# Patient Record
Sex: Male | Born: 2000 | Race: White | Hispanic: No | Marital: Single
Health system: Southern US, Community
[De-identification: ages and names within clinical notes are randomized; demographics above are authoritative.]

## PROBLEM LIST (undated history)

## (undated) ENCOUNTER — Ambulatory Visit: Admission: EM | Source: Home / Self Care

## (undated) DIAGNOSIS — T7840XA Allergy, unspecified, initial encounter: Secondary | ICD-10-CM

## (undated) DIAGNOSIS — J45909 Unspecified asthma, uncomplicated: Secondary | ICD-10-CM

## (undated) DIAGNOSIS — R0683 Snoring: Secondary | ICD-10-CM

## (undated) HISTORY — DX: Unspecified asthma, uncomplicated: J45.909

## (undated) HISTORY — DX: Snoring: R06.83

## (undated) HISTORY — PX: TYMPANOSTOMY TUBE PLACEMENT: SHX32

## (undated) HISTORY — PX: CIRCUMCISION: SUR203

## (undated) HISTORY — DX: Allergy, unspecified, initial encounter: T78.40XA

---
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
## 2020-09-01 ENCOUNTER — Ambulatory Visit: Payer: Self-pay | Admitting: *Deleted

## 2020-09-01 NOTE — Telephone Encounter (Signed)
°  Patient is calling from work- he has been having stomach pian since Sunday- sharp stabbing pain at times- bilateral- but mostly on the R side. Patient had nausea with pain today. No appointment in office- advised UC for evaluation- not sur eif patient is going to go. Reason for Disposition  [1] MILD-MODERATE pain AND [2] constant AND [3] present > 2 hours  Answer Assessment - Initial Assessment Questions 1. LOCATION: "Where does it hurt?"     Bilateral pain in abdomen- more R- lower 2. RADIATION: "Does the pain shoot anywhere else?" (e.g., chest, back)     No radiation 3. ONSET: "When did the pain begin?" (Minutes, hours or days ago)      Started Sunday afternoon 4. SUDDEN: "Gradual or sudden onset?"     Gradual pain 5. PATTERN "Does the pain come and go, or is it constant?"    - If constant: "Is it getting better, staying the same, or worsening?"      (Note: Constant means the pain never goes away completely; most serious pain is constant and it progresses)     - If intermittent: "How long does it last?" "Do you have pain now?"     (Note: Intermittent means the pain goes away completely between bouts)     Constant- sharp pain comes and goes 6. SEVERITY: "How bad is the pain?"  (e.g., Scale 1-10; mild, moderate, or severe)    - MILD (1-3): doesn't interfere with normal activities, abdomen soft and not tender to touch     - MODERATE (4-7): interferes with normal activities or awakens from sleep, tender to touch     - SEVERE (8-10): excruciating pain, doubled over, unable to do any normal activities       3-4- but when sharp- 8-9 7. RECURRENT SYMPTOM: "Have you ever had this type of stomach pain before?" If Yes, ask: "When was the last time?" and "What happened that time?"      no 8. CAUSE: "What do you think is causing the stomach pain?"     unknown 9. RELIEVING/AGGRAVATING FACTORS: "What makes it better or worse?" (e.g., movement, antacids, bowel movement)     Noting helping- has not  medicated 10. OTHER SYMPTOMS: "Has there been any vomiting, diarrhea, constipation, or urine problems?"       Some nausea with pain  Protocols used: ABDOMINAL PAIN - MALE-A-AH

## 2020-09-02 ENCOUNTER — Encounter: Payer: Self-pay | Admitting: Emergency Medicine

## 2020-09-02 ENCOUNTER — Ambulatory Visit: Payer: Self-pay

## 2020-09-02 ENCOUNTER — Emergency Department
Admission: EM | Admit: 2020-09-02 | Discharge: 2020-09-02 | Disposition: A | Discharge status: Home / Self Care | Payer: BC Managed Care – PPO | Attending: Emergency Medicine | Admitting: Emergency Medicine

## 2020-09-02 ENCOUNTER — Other Ambulatory Visit: Payer: Self-pay

## 2020-09-02 ENCOUNTER — Emergency Department: Payer: BC Managed Care – PPO

## 2020-09-02 DIAGNOSIS — Z7722 Contact with and (suspected) exposure to environmental tobacco smoke (acute) (chronic): Secondary | ICD-10-CM | POA: Insufficient documentation

## 2020-09-02 DIAGNOSIS — R1031 Right lower quadrant pain: Secondary | ICD-10-CM | POA: Diagnosis present

## 2020-09-02 DIAGNOSIS — J45909 Unspecified asthma, uncomplicated: Secondary | ICD-10-CM | POA: Diagnosis not present

## 2020-09-02 DIAGNOSIS — I880 Nonspecific mesenteric lymphadenitis: Secondary | ICD-10-CM | POA: Diagnosis not present

## 2020-09-02 LAB — CBC
HCT: 44.3 % (ref 39.0–52.0)
Hemoglobin: 15.4 g/dL (ref 13.0–17.0)
MCH: 28.9 pg (ref 26.0–34.0)
MCHC: 34.8 g/dL (ref 30.0–36.0)
MCV: 83.1 fL (ref 80.0–100.0)
Platelets: 197 10*3/uL (ref 150–400)
RBC: 5.33 MIL/uL (ref 4.22–5.81)
RDW: 12.7 % (ref 11.5–15.5)
WBC: 5.3 10*3/uL (ref 4.0–10.5)
nRBC: 0 % (ref 0.0–0.2)

## 2020-09-02 LAB — COMPREHENSIVE METABOLIC PANEL
ALT: 23 U/L (ref 0–44)
AST: 15 U/L (ref 15–41)
Albumin: 4.7 g/dL (ref 3.5–5.0)
Alkaline Phosphatase: 59 U/L (ref 38–126)
Anion gap: 9 (ref 5–15)
BUN: 15 mg/dL (ref 6–20)
CO2: 28 mmol/L (ref 22–32)
Calcium: 9.6 mg/dL (ref 8.9–10.3)
Chloride: 101 mmol/L (ref 98–111)
Creatinine, Ser: 0.93 mg/dL (ref 0.61–1.24)
GFR, Estimated: >60 mL/min (ref >60)
Glucose, Bld: 95 mg/dL (ref 70–99)
Potassium: 4 mmol/L (ref 3.5–5.1)
Sodium: 138 mmol/L (ref 135–145)
Total Bilirubin: 1.1 mg/dL (ref 0.3–1.2)
Total Protein: 7.6 g/dL (ref 6.5–8.1)

## 2020-09-02 LAB — URINALYSIS, COMPLETE (UACMP) WITH MICROSCOPIC
Bacteria, UA: NONE SEEN
Bilirubin Urine: NEGATIVE
Glucose, UA: NEGATIVE mg/dL
Hgb urine dipstick: NEGATIVE
Ketones, ur: NEGATIVE mg/dL
Leukocytes,Ua: NEGATIVE
Nitrite: NEGATIVE
Protein, ur: NEGATIVE mg/dL
Specific Gravity, Urine: 1.018 (ref 1.005–1.030)
Squamous Epithelial / LPF: NONE SEEN (ref 0–5)
pH: 7 (ref 5.0–8.0)

## 2020-09-02 LAB — LIPASE, BLOOD: Lipase: 31 U/L (ref 11–51)

## 2020-09-02 MED ORDER — IOHEXOL 300 MG/ML  SOLN
100.0000 mL | Freq: Once | INTRAMUSCULAR | Status: AC | PRN
  Administered 2020-09-02: 100 mL via INTRAVENOUS
  Filled 2020-09-02: qty 100

## 2020-09-02 MED ORDER — ONDANSETRON 4 MG PO TBDP: 4.0000 mg | ORAL_TABLET | Freq: Three times a day (TID) | ORAL | 0 refills | Status: AC | PRN

## 2020-09-02 NOTE — ED Triage Notes (Signed)
Pt presents to ED via POV with c/o RLQ abdominal pain x 4 days. Pt states has been intermittently dry heaving due to pain. Pt denies any abdominal surgeries, A&O x4, ambulatory to triage with steady gait. Respirations even and unlabored.

## 2020-09-02 NOTE — Discharge Instructions (Addendum)
Your exam is overall normal as well as your labs.  Your CT scan revealed a normal appendix but did show some inflammation of the lymph nodes within the lower abdomen.  This likely is a source of your tenderness and pain.  Continue to monitor symptoms.  Take Tylenol Motrin as needed.  Follow-up with your provider or return to the ED for sharply worsening symptoms as discussed.

## 2020-09-02 NOTE — Telephone Encounter (Signed)
Pt. Is in ED now waiting to be seen for abdominal pain. Has pain right lower quadrant that started Sunday. No availability in the practice today per [place]. Instructed pt. To stay and be seen in ED. Verbalizes understanding.  Reason for Disposition . [1] MILD-MODERATE pain AND [2] constant AND [3] present > 2 hours  Answer Assessment - Initial Assessment Questions 1. LOCATION: "Where does it hurt?"     Right lower 2. RADIATION: "Does the pain shoot anywhere else?" (e.g., chest, back)     No 3. ONSET: "When did the pain begin?" (Minutes, hours or days ago)      Sunday 4. SUDDEN: "Gradual or sudden onset?"     Gradual 5. PATTERN "Does the pain come and go, or is it constant?"    - If constant: "Is it getting better, staying the same, or worsening?"      (Note: Constant means the pain never goes away completely; most serious pain is constant and it progresses)     - If intermittent: "How long does it last?" "Do you have pain now?"     (Note: Intermittent means the pain goes away completely between bouts)     Constant 6. SEVERITY: "How bad is the pain?"  (e.g., Scale 1-10; mild, moderate, or severe)    - MILD (1-3): doesn't interfere with normal activities, abdomen soft and not tender to touch     - MODERATE (4-7): interferes with normal activities or awakens from sleep, tender to touch     - SEVERE (8-10): excruciating pain, doubled over, unable to do any normal activities       5 7. RECURRENT SYMPTOM: "Have you ever had this type of stomach pain before?" If Yes, ask: "When was the last time?" and "What happened that time?"      No 8. CAUSE: "What do you think is causing the stomach pain?"     Unsure 9. RELIEVING/AGGRAVATING FACTORS: "What makes it better or worse?" (e.g., movement, antacids, bowel movement)     No 10. OTHER SYMPTOMS: "Has there been any vomiting, diarrhea, constipation, or urine problems?"       No  Protocols used: ABDOMINAL PAIN - MALE-A-AH

## 2020-09-02 NOTE — ED Provider Notes (Signed)
[facility] Emergency Department Provider Note ____________________________________________  Time seen: 1207  I have reviewed the triage vital signs and the nursing notes.  HISTORY  Chief Complaint  Abdominal Pain   HPI Nathan Greene is a 19 y.o. male, without significant medical history, presents himself to the ED for evaluation of right lower quadrant abdominal pain.  Patient describes a 4-day complaint of pain has been intermittent. He had sudden onset of doubling-over RLQ pain on Sunday, he is also had severe dry heaving secondary to the pain.  He denies any previous history of abdominal surgeries, diarrhea, constipation, or food aversion. He reports the pain is persistent and intermittently jumps to a sharp, stabbing pain. He is without anorexia, fevers, vomiting, dysuria or bowel changes  Past Medical History:  Diagnosis Date  . Allergy   . Asthma   . Snoring     Patient Active Problem List   Diagnosis Date Noted  . History of cholesteatoma 12/15/2015  . Perennial allergic rhinitis with seasonal variation 03/10/2008  . Asthma, mild intermittent 01/29/2008    Past Surgical History:  Procedure Laterality Date  . CIRCUMCISION  [id number]  . MYRINGOTOMY WITH TUBE PLACEMENT Bilateral 12/11/2015   Procedure: MYRINGOTOMY WITH TUBE PLACEMENT;  Surgeon: Linus Salmons, MD;  Location: [facility];  Service: ENT;  Laterality: Bilateral;  BUTTERFLY TUBES  . TYMPANOSTOMY TUBE PLACEMENT      Prior to Admission medications   Medication Sig Start Date End Date Taking? Authorizing Provider  albuterol (PROAIR HFA) 108 (90 Base) MCG/ACT inhaler Inhale 2 puffs into the lungs every 6 (six) hours as needed for wheezing or shortness of breath. 04/24/17   Alba Cory, MD  fluticasone (FLONASE) 50 MCG/ACT nasal spray Place 2 sprays into both nostrils daily. 04/24/17   Alba Cory, MD  loratadine (CLARITIN) 10 MG tablet Take 1 tablet (10 mg total) by mouth  daily. 04/24/17   Alba Cory, MD  ondansetron (ZOFRAN ODT) 4 MG disintegrating tablet Take 1 tablet (4 mg total) by mouth every 8 (eight) hours as needed. 09/02/20   Sylis Ketchum, Charlesetta Ivory, PA-C  triamcinolone cream (KENALOG) 0.1 % Apply 1 application topically 2 (two) times daily. 04/24/17   Alba Cory, MD    Allergies Patient has no known allergies.  Family History  Problem Relation Age of Onset  . Diabetes Father   . Rashes / Skin problems Sister   . ADD / ADHD Brother     Social History Social History   Tobacco Use  . Smoking status: Passive Smoke Exposure - Never Smoker  . Smokeless tobacco: Never Used  Substance Use Topics  . Alcohol use: No    Alcohol/week: 0.0 standard drinks  . Drug use: No    Review of Systems  Constitutional: Negative for fever. Eyes: Negative for visual changes. ENT: Negative for sore throat. Cardiovascular: Negative for chest pain. Respiratory: Negative for shortness of breath. Gastrointestinal: Positive for RLQ abdominal pain. Denies nausea, vomiting and diarrhea. Genitourinary: Negative for dysuria. Musculoskeletal: Negative for back pain. Skin: Negative for rash. Neurological: Negative for headaches, focal weakness or numbness. ____________________________________________  PHYSICAL EXAM:  VITAL SIGNS: ED Triage Vitals  Enc Vitals Group     BP 09/02/20 0816 127/74     Pulse Rate 09/02/20 0816 (!) 51     Resp 09/02/20 0816 20     Temp 09/02/20 0816 98 F (36.7 C)     Temp Source 09/02/20 0816 Oral     SpO2 09/02/20 0816 100 %  Weight 09/02/20 0817 160 lb (72.6 kg)     Height 09/02/20 0817 5\' 6"  (1.676 m)     Head Circumference --      Peak Flow --      Pain Score 09/02/20 0816 8     Pain Loc --      Pain Edu? --      Excl. in GC? --     Constitutional: Alert and oriented. Well appearing and in no distress. Head: Normocephalic and atraumatic. Eyes: Conjunctivae are normal. Normal extraocular  movements Cardiovascular: Normal rate, regular rhythm. Normal distal pulses. Respiratory: Normal respiratory effort. No wheezes/rales/rhonchi. Gastrointestinal: Soft, flat, and generally nontender, except for tenderness over RLQ at McBurney's point. Positive Rovsing's sign. No distention, rebound, guarding, or rigidity. Normoactive bowel sounds x 4 quadrants. No CVA tenderness on evaluation.  Musculoskeletal: Nontender with normal range of motion in all extremities.  Neurologic:  Normal gait without ataxia. Normal speech and language. No gross focal neurologic deficits are appreciated. Skin:  Skin is warm, dry and intact. No rash noted. Psychiatric: Mood and affect are normal. Patient exhibits appropriate insight and judgment. ____________________________________________   LABS (pertinent positives/negatives) Labs Reviewed  URINALYSIS, COMPLETE (UACMP) WITH MICROSCOPIC - Abnormal; Notable for the following components:      Result Value   Color, Urine YELLOW (*)    APPearance CLEAR (*)    All other components within normal limits  LIPASE, BLOOD  COMPREHENSIVE METABOLIC PANEL  CBC  ____________________________________________   RADIOLOGY  CT ABD/Pelvis w/ CM  IMPRESSION: Normal appendix.  Shotty mesenteric adenopathy within the ileocecal lymph node chain, possibly reactive or inflammatory. No pathologic adenopathy within the abdomen and pelvis. ____________________________________________  PROCEDURES  Procedures ____________________________________________  INITIAL IMPRESSION / ASSESSMENT AND PLAN / ED COURSE  Differential diagnosis includes, but is not limited to, acute appendicitis, renal colic, testicular torsion, urinary tract infection/pyelonephritis, prostatitis,  epididymitis, diverticulitis, small bowel obstruction or ileus, colitis, abdominal aortic aneurysm, gastroenteritis, hernia, etc.  Patient with a 4-day complaint of right lower quadrant abdominal pain.  His  labs are stable, without leukocytosis acute anemia, or signs of liver or kidney dysfunction.  The patient is without interim nausea vomiting during his course.  He was evaluated with a CT scan for his complaints which showed a normal appendix, but reveal terminal ileitis, and mesenteric adenitis.   I reviewed the CT images and discussed the case with Dr. 09/04/20 (Surg). He is satisfisied that the appendix was fully visualized, and the adenitis is consistent with the exam findings.   Patient was updated and relieved by his CT results. I have discussed the results with the patient's stepfather, at his consent. The patient is discharged home, in stable condition, with a work note, nausea medicine, and return precautions.   Nathan Greene was evaluated in Emergency Department on 09/02/2020 for the symptoms described in the history of present illness. He was evaluated in the context of the global COVID-19 pandemic, which necessitated consideration that the patient might be at risk for infection with the SARS-CoV-2 virus that causes COVID-19. Institutional protocols and algorithms that pertain to the evaluation of patients at risk for COVID-19 are in a state of rapid change based on information released by regulatory bodies including the CDC and federal and state organizations. These policies and algorithms were followed during the patient's care in the ED. ____________________________________________  FINAL CLINICAL IMPRESSION(S) / ED DIAGNOSES  Final diagnoses:  Right lower quadrant abdominal pain  Mesenteric adenitis  Lissa Hoard, PA-C 09/02/20 1425    Phineas Semen, MD 09/02/20 1430

## 2020-09-02 NOTE — ED Notes (Signed)
Pt denies any n/v/d.

## 2021-04-06 ENCOUNTER — Ambulatory Visit: Payer: BC Managed Care – PPO | Admitting: Dermatology

## 2021-04-06 ENCOUNTER — Other Ambulatory Visit: Payer: Self-pay

## 2021-04-06 DIAGNOSIS — L209 Atopic dermatitis, unspecified: Secondary | ICD-10-CM | POA: Diagnosis not present

## 2021-04-06 MED ORDER — PIMECROLIMUS 1 % EX CREA: TOPICAL_CREAM | CUTANEOUS | 2 refills | Status: AC

## 2021-04-06 MED ORDER — CLOBETASOL PROPIONATE 0.05 % EX LOTN: TOPICAL_LOTION | CUTANEOUS | 2 refills | Status: AC

## 2021-04-06 NOTE — Progress Notes (Signed)
   Follow-Up Visit   Subjective  Nathan Greene is a 20 y.o. male who presents for the following: Psoriasis (Hands x several years. He has used TMC 0.1% Cream and Eucrisa Ointment in the past, but ran out a couple of months ago. 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 Hands itch a lot.  Patient has allergies and asthma.  The following portions of the chart were reviewed this encounter and updated as appropriate:       Review of Systems:  No other skin or systemic complaints except as noted in HPI or Assessment and Plan.  Objective  Well appearing patient in no apparent distress; mood and affect are within normal limits.  A focused examination was performed including hands, arms. Relevant physical exam findings are noted in the Assessment and Plan.  Objective  hands: Pink scaly patches of the webspaces, proximal nail folds, MCPs hands with generalized xerosis  3% BSA   Assessment & Plan  Atopic dermatitis, unspecified type hands  Hand dermatitis- not improving with topical treatment  Atopic dermatitis (eczema) is a chronic, relapsing, pruritic condition that can significantly affect quality of life. It is often associated with allergic rhinitis and/or asthma and can require treatment with topical medications, phototherapy, or in severe cases a biologic medication called Dupixent in older children and adults.   Hand Dermatitis is a chronic type of eczema that can come and go on the hands and fingers.  While there is no cure, the rash and symptoms can be managed with topical prescription medications, and for more severe cases, with systemic medications.  Recommend mild soap and routine use of moisturizing cream after handwashing.  Minimize soap/water exposure when possible.     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  The rash on his hands is causing problems with his work.  Discussed Dupixent injections. Patient was  given Dupixent paperwork. He will fill out and bring back to office.   Start Elidel Cream Apply to AA hands QD/BID until rash improved dsp 60g 2Rf.  Start Clobetasol lotion Apply to AA hands QD/BID until rash improved 2Rf.  Avoid face, groin, axilla.  Minimize harsh soaps on hands, use moisturizers after handwashing. Minimize frequency of handwashing when possible.  Topical steroids (such as triamcinolone, fluocinolone, fluocinonide, mometasone, clobetasol, halobetasol, betamethasone, hydrocortisone) can cause thinning and lightening of the skin if they are used for too long in the same area. Your physician has selected the right strength medicine for your problem and area affected on the body. Please use your medication only as directed by your physician to prevent side effects.       pimecrolimus (ELIDEL) 1 % cream - hands  Clobetasol Propionate 0.05 % lotion - hands  Return in about 1 month (around 05/07/2021) for AD.   Documentation: I have reviewed the above documentation for accuracy and completeness, and I agree with the above.  Willeen Niece MD

## 2021-04-06 NOTE — Patient Instructions (Addendum)
Topical steroids (such as triamcinolone, fluocinolone, fluocinonide, mometasone, clobetasol, halobetasol, betamethasone, hydrocortisone) can cause thinning and lightening of the skin if they are used for too long in the same area. Your physician has selected the right strength medicine for your problem and area affected on the body. Please use your medication only as directed by your physician to prevent side effects.    If you have any questions or concerns for your doctor, please call our main line at [phone] and press option 4 to reach your doctor's medical assistant. If no one answers, please leave a voicemail as directed and we will return your call as soon as possible. Messages left after 4 pm will be answered the following business day.   You may also send us a message via MyChart. We typically respond to MyChart messages within 1-2 business days.  For prescription refills, please ask your pharmacy to contact our office. Our fax number is [phone].  If you have an urgent issue when the clinic is closed that cannot wait until the next business day, you can page your doctor at the number below.    Please note that while we do our best to be available for urgent issues outside of office hours, we are not available 24/7.   If you have an urgent issue and are unable to reach us, you may choose to seek medical care at your doctor's office, retail clinic, urgent care center, or emergency room.  If you have a medical emergency, please immediately call 911 or go to the emergency department.  Pager Numbers  - Dr. Kowalski: [phone]  - Dr. Moye: [phone]  - Dr. Stewart: [phone]  In the event of inclement weather, please call our main line at [phone] for an update on the status of any delays or closures.  Dermatology Medication Tips: Please keep the boxes that topical medications come in in order to help keep track of the instructions about where and how to use  these. Pharmacies typically print the medication instructions only on the boxes and not directly on the medication tubes.   If your medication is too expensive, please contact our office at [phone] option 4 or send us a message through MyChart.   We are unable to tell what your co-pay for medications will be in advance as this is different depending on your insurance coverage. However, we may be able to find a substitute medication at lower cost or fill out paperwork to get insurance to cover a needed medication.   If a prior authorization is required to get your medication covered by your insurance company, please allow us 1-2 business days to complete this process.  Drug prices often vary depending on where the prescription is filled and some pharmacies may offer cheaper prices.  The website www.goodrx.com contains coupons for medications through different pharmacies. The prices here do not account for what the cost may be with help from insurance (it may be cheaper with your insurance), but the website can give you the price if you did not use any insurance.  - You can print the associated coupon and take it with your prescription to the pharmacy.  - You may also stop by our office during regular business hours and pick up a GoodRx coupon card.  - If you need your prescription sent electronically to a different pharmacy, notify our office through [facility] MyChart or by phone at [phone] option 4.  

## 2021-04-19 ENCOUNTER — Telehealth: Payer: Self-pay

## 2021-04-19 NOTE — Telephone Encounter (Signed)
Ok to send in tacrolimus 0.1% ointment.  Please explain to pt that the cream was denied, and that insurance is requiring that he use this ointment.  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  He can try using at night before bed.

## 2021-04-19 NOTE — Telephone Encounter (Signed)
Pimecrolimus denied by insurance. Pt needs to try and fail 30 days minimum of tacrolimus.

## 2021-04-19 NOTE — Telephone Encounter (Signed)
Pt states that he has been using the clobetasol lotion and that has been helping some. He states that he wants to try just that for now and will see how his hands are doing at the f/u on 6/28 to decide if he wants to try the tacrolimus as well.   I also offered [facility] for the pimecrolimus for $65. Pt deferred for now.

## 2021-05-11 ENCOUNTER — Ambulatory Visit: Payer: BC Managed Care – PPO | Admitting: Dermatology

## 2021-07-11 IMAGING — CT CT ABD-PELV W/ CM
2 of 4 series · 16 of 46 positions shown, 18 images · IV contrast (APPLIED)
Comparison: None.

CLINICAL DATA: Right lower quadrant abdominal pain

EXAM:
CT ABDOMEN AND PELVIS WITH CONTRAST
TECHNIQUE: Multidetector CT imaging of the abdomen and pelvis was performed
using the standard protocol following bolus administration of
intravenous contrast.
CONTRAST:  100mL OMNIPAQUE IOHEXOL 300 MG/ML  SOLN

[Series 2: routine abd/pel with · axial · 0.70mm/px · z∈[-491,-36]mm · 13 of 101 slices shown, 15 images]
[im 5/101  soft-tissue]
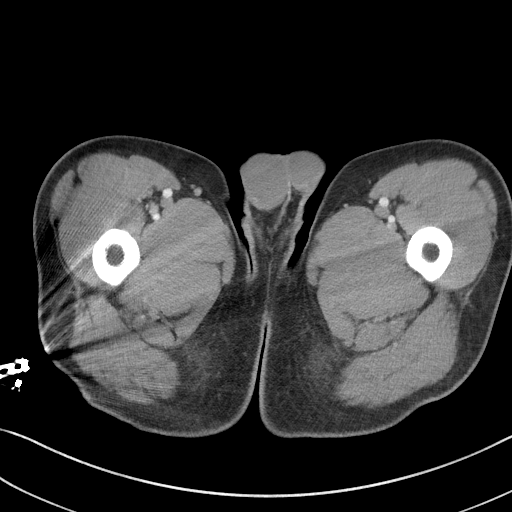
[im 5/101  bone]
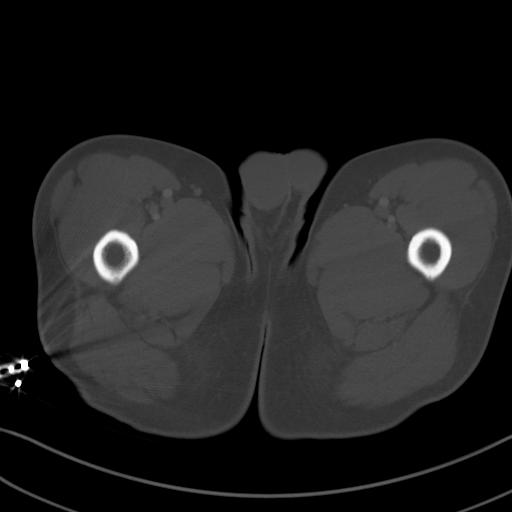
[im 14/101  soft-tissue]
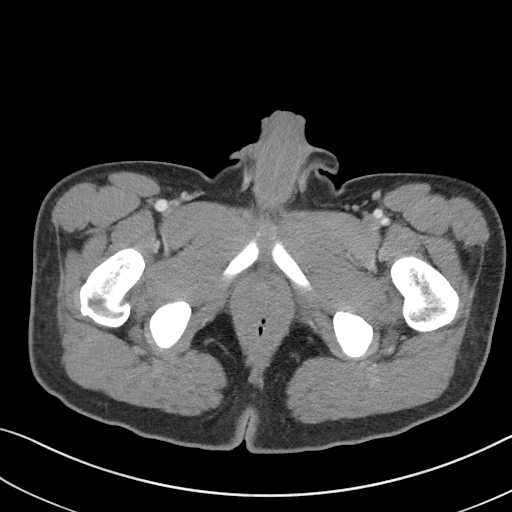
[im 22/101  soft-tissue]
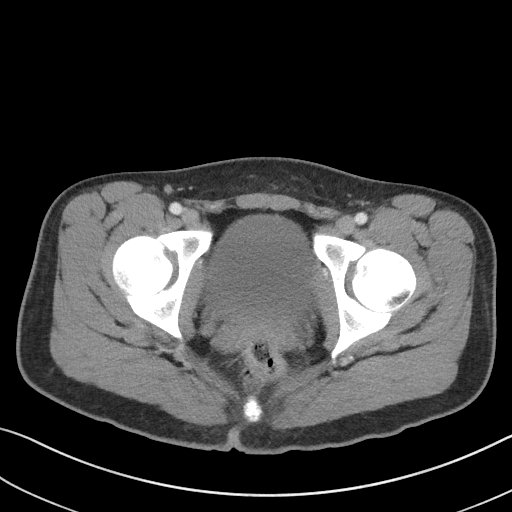
[im 27/101  soft-tissue]
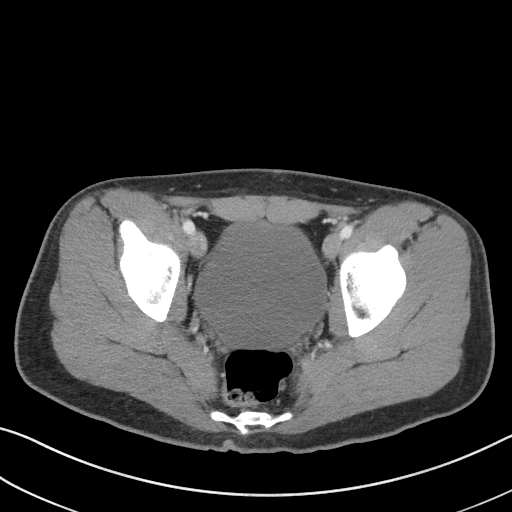
[im 35/101  soft-tissue]
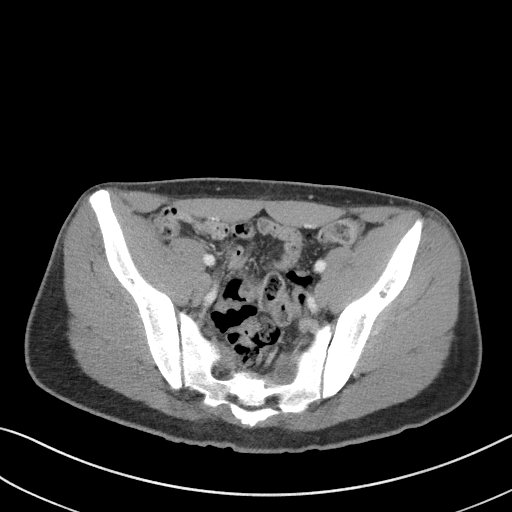
[im 44/101  soft-tissue]
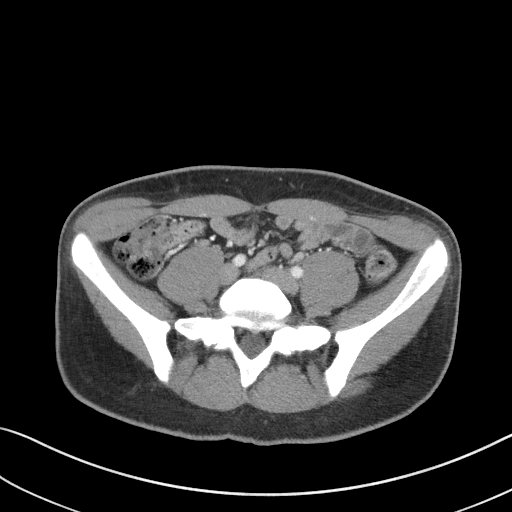
[im 53/101  soft-tissue]
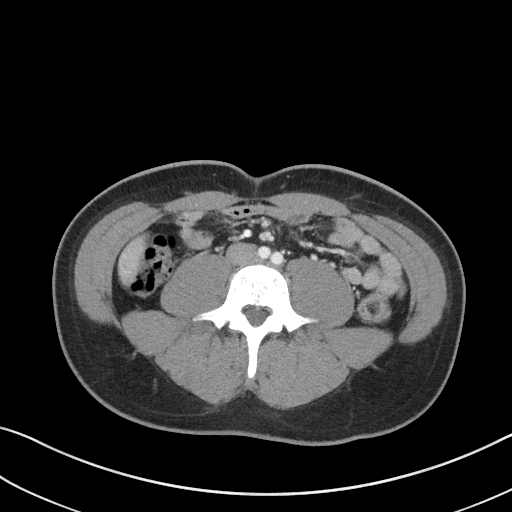
[im 57/101  soft-tissue]
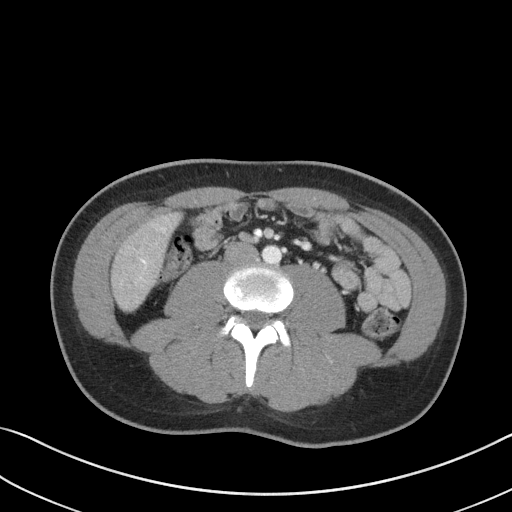
[im 66/101  soft-tissue]
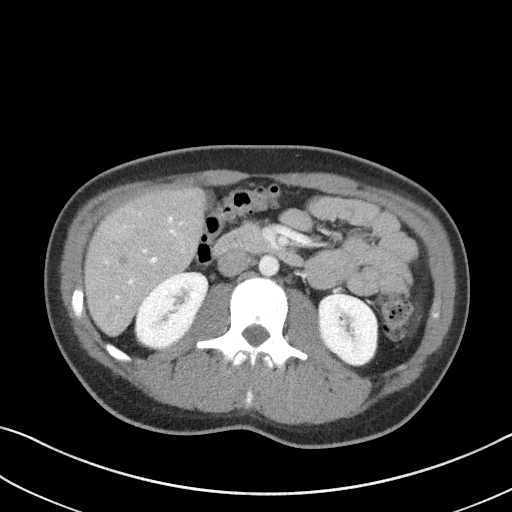
[im 66/101  bone]
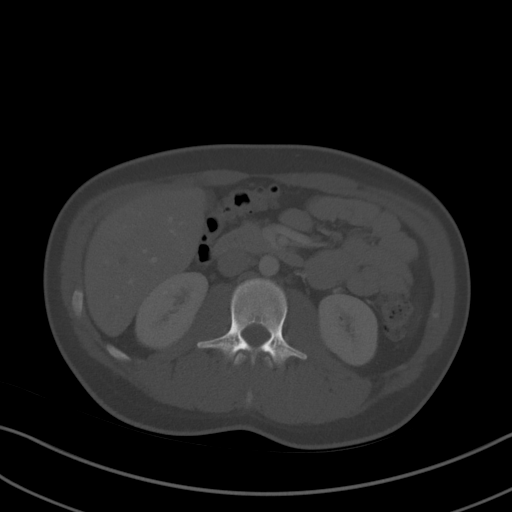
[im 74/101  soft-tissue]
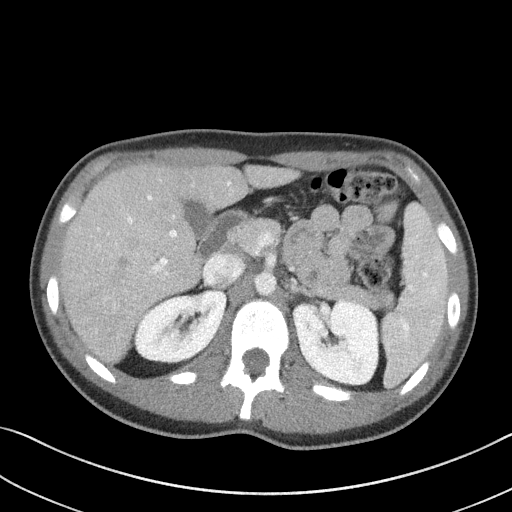
[im 79/101  soft-tissue]
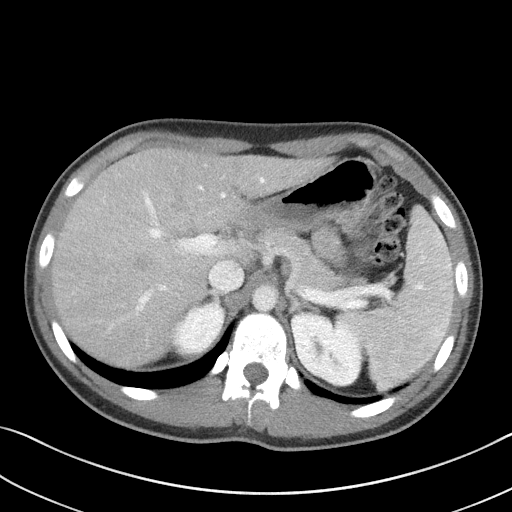
[im 87/101  soft-tissue]
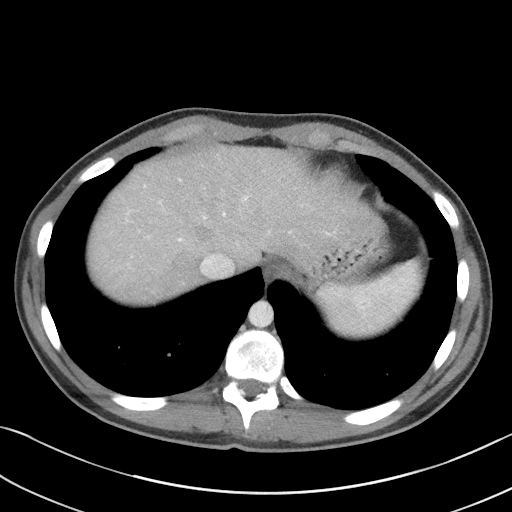
[im 96/101  soft-tissue]
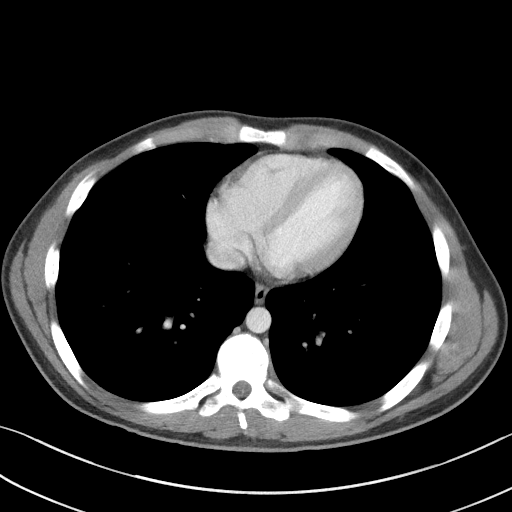

[Series 5: coronal st · coronal · 0.72mm/px · 3 of 73 slices shown]
[im 25/73  soft-tissue]
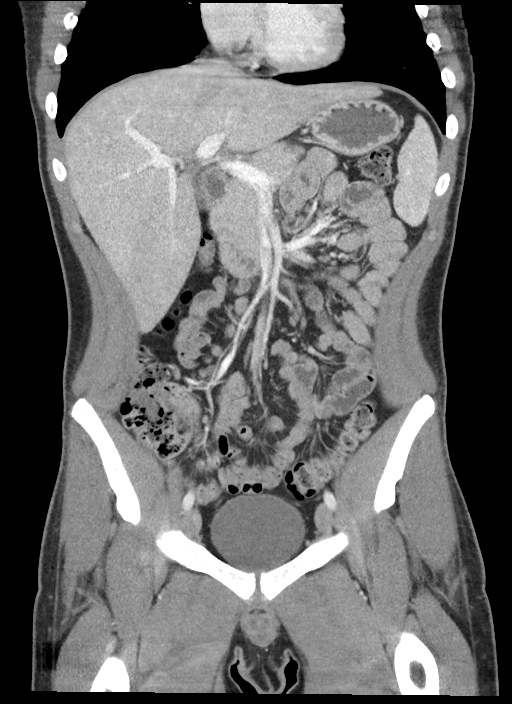
[im 33/73  soft-tissue]
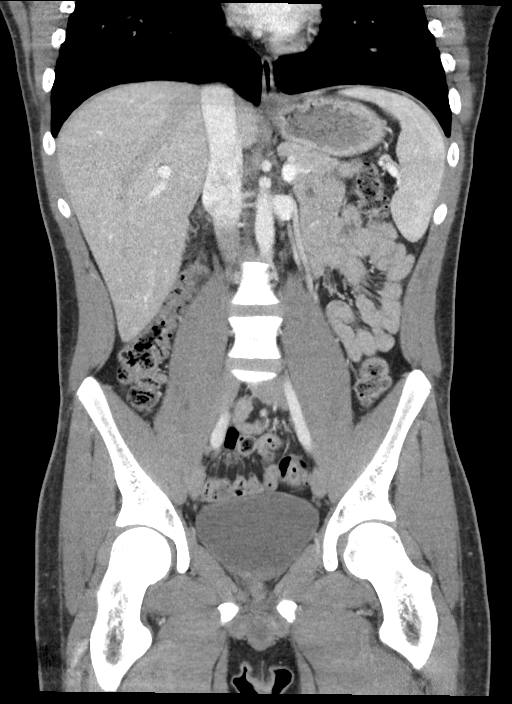
[im 41/73  soft-tissue]
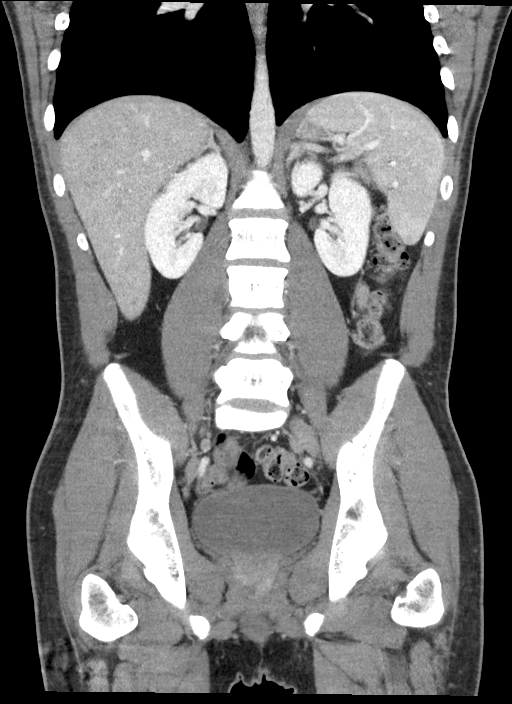

[16 of 46 positions shown; findings below may reference images not displayed]

FINDINGS: Lower chest: The visualized lung bases are clear bilaterally. The
visualized heart and pericardium are unremarkable.

Hepatobiliary: The liver is mildly enlarged. No focal liver lesions.
No intra or extrahepatic biliary ductal dilation. Gallbladder
unremarkable.

Pancreas: Unremarkable

Spleen: Unremarkable

Adrenals/Urinary Tract: Adrenal glands are unremarkable. Kidneys are
normal, without renal calculi, focal lesion, or hydronephrosis.
Bladder is unremarkable.

Stomach/Bowel: The stomach, small bowel, and large bowel are
unremarkable. The appendix is retrocecal and is normal. There is
trace simple appearing free fluid within the pelvis, abnormal, but
nonspecific. No free intraperitoneal gas. There is shotty
lymphadenopathy seen along the ileocecal lymph node chain within the
small bowel mesentery, without pathologic enlargement. This is
nonspecific but may be reactive, as can be seen with infectious or
inflammatory terminal ileitis, or inflammatory, as can be seen with
mesenteric adenitis.

Vascular/Lymphatic: No frankly pathologic adenopathy within the
abdomen and pelvis. The abdominal vasculature is normal.

Reproductive: Prostate is unremarkable.

Other: None significant

Musculoskeletal: No acute or significant osseous findings.
IMPRESSION: Normal appendix.

Shotty mesenteric adenopathy within the ileocecal lymph node chain,
possibly reactive or inflammatory. No pathologic adenopathy within
the abdomen and pelvis.

## 2022-09-05 ENCOUNTER — Ambulatory Visit
Admission: EM | Admit: 2022-09-05 | Discharge: 2022-09-05 | Disposition: A | Discharge status: Home / Self Care | Payer: BC Managed Care – PPO | Attending: Urgent Care | Admitting: Urgent Care

## 2022-09-05 DIAGNOSIS — R0989 Other specified symptoms and signs involving the circulatory and respiratory systems: Secondary | ICD-10-CM | POA: Insufficient documentation

## 2022-09-05 DIAGNOSIS — Z1152 Encounter for screening for COVID-19: Secondary | ICD-10-CM | POA: Insufficient documentation

## 2022-09-05 DIAGNOSIS — J020 Streptococcal pharyngitis: Secondary | ICD-10-CM | POA: Insufficient documentation

## 2022-09-05 LAB — RESP PANEL BY RT-PCR (RSV, FLU A&B, COVID)  RVPGX2
Influenza A by PCR: NEGATIVE
Influenza B by PCR: NEGATIVE
Resp Syncytial Virus by PCR: NEGATIVE
SARS Coronavirus 2 by RT PCR: NEGATIVE

## 2022-09-05 LAB — POCT RAPID STREP A (OFFICE): Rapid Strep A Screen: POSITIVE — AB

## 2022-09-05 MED ORDER — AMOXICILLIN 500 MG PO CAPS: 500.0000 mg | ORAL_CAPSULE | Freq: Two times a day (BID) | ORAL | 0 refills | Status: AC

## 2022-09-05 NOTE — Discharge Instructions (Addendum)
Follow up here or with your primary care provider if your symptoms are worsening or not improving with treatment.     

## 2022-09-05 NOTE — ED Provider Notes (Signed)
Nathan Greene    CSN: [id number] Arrival date & time: 09/05/22  6948      History   Chief Complaint Chief Complaint  Patient presents with   Cough   Nasal Congestion   Sore Throat    HPI Nathan Greene is a 21 y.o. male.    Cough Sore Throat    Patient presents to UC with complaint of sore throat, nasal congestion, cough starting yesterday.  Requesting testing for COVID and strep throat.   In addition to other symptoms, endorses tactile fever, myalgias, chills.  Cough is productive.  History of asthma but denies any worsening symptoms.  No dyspnea, no wheezing, no chest tightness.  Past Medical History:  Diagnosis Date   Allergy    Asthma    Snoring     Patient Active Problem List   Diagnosis Date Noted   History of cholesteatoma 12/15/2015   Perennial allergic rhinitis with seasonal variation 03/10/2008   Asthma, mild intermittent 01/29/2008    Past Surgical History:  Procedure Laterality Date   CIRCUMCISION  [id number]   MYRINGOTOMY WITH TUBE PLACEMENT Bilateral 12/11/2015   Procedure: MYRINGOTOMY WITH TUBE PLACEMENT;  Surgeon: Linus Salmons, MD;  Location: [facility];  Service: ENT;  Laterality: Bilateral;  BUTTERFLY TUBES   TYMPANOSTOMY TUBE PLACEMENT         Home Medications    Prior to Admission medications   Medication Sig Start Date End Date Taking? Authorizing Provider  albuterol (PROAIR HFA) 108 (90 Base) MCG/ACT inhaler Inhale 2 puffs into the lungs every 6 (six) hours as needed for wheezing or shortness of breath. 04/24/17   Alba Cory, MD  amoxicillin (AMOXIL) 500 MG capsule Take 500 mg by mouth 3 (three) times daily. 03/24/22   [provider]  Clobetasol Propionate 0.05 % lotion Apply to affected areas hands 1-2 times a day until rash improved. 04/06/21   Willeen Niece, MD  fluticasone [facility]) 50 MCG/ACT nasal spray Place 2 sprays into both nostrils daily. 04/24/17   Alba Cory, MD  loratadine (CLARITIN)  10 MG tablet Take 1 tablet (10 mg total) by mouth daily. 04/24/17   Alba Cory, MD  ondansetron (ZOFRAN ODT) 4 MG disintegrating tablet Take 1 tablet (4 mg total) by mouth every 8 (eight) hours as needed. 09/02/20   Menshew, Charlesetta Ivory, PA-C  pimecrolimus (ELIDEL) 1 % cream Apply to affected areas hands 1-2 times a day until rash improved. 04/06/21   Willeen Niece, MD  triamcinolone cream (KENALOG) 0.1 % Apply 1 application topically 2 (two) times daily. 04/24/17   Alba Cory, MD    Family History Family History  Problem Relation Age of Onset   Diabetes Father    Rashes / Skin problems Sister    ADD / ADHD Brother     Social History Social History   Tobacco Use   Smoking status: Passive Smoke Exposure - Never Smoker   Smokeless tobacco: Never  Substance Use Topics   Alcohol use: No    Alcohol/week: 0.0 standard drinks of alcohol   Drug use: No     Allergies   Patient has no known allergies.   Review of Systems Review of Systems  Respiratory:  Positive for cough.      Physical Exam Triage Vital Signs ED Triage Vitals [09/05/22 0941]  Enc Vitals Group     BP 114/76     Pulse Rate 76     Resp 15     Temp 99.2 F (37.3  C)     Temp src      SpO2 98 %     Weight      Height      Head Circumference      Peak Flow      Pain Score 8     Pain Loc      Pain Edu?      Excl. in [place]?    No data found.  Updated Vital Signs BP 114/76   Pulse 76   Temp 99.2 F (37.3 C)   Resp 15   SpO2 98%   Visual Acuity Right Eye Distance:   Left Eye Distance:   Bilateral Distance:    Right Eye Near:   Left Eye Near:    Bilateral Near:     Physical Exam Vitals reviewed.  Constitutional:      Appearance: He is well-developed.  HENT:     Mouth/Throat:     Mouth: Mucous membranes are moist.     Pharynx: Posterior oropharyngeal erythema present. No oropharyngeal exudate.     Tonsils: Tonsillar exudate present.  Cardiovascular:     Rate and Rhythm: Normal  rate and regular rhythm.     Heart sounds: Normal heart sounds.  Pulmonary:     Effort: Pulmonary effort is normal.     Breath sounds: Normal breath sounds. No wheezing.  Neurological:     Mental Status: He is alert.      UC Treatments / Results  Labs (all labs ordered are listed, but only abnormal results are displayed) Labs Reviewed  RESP PANEL BY RT-PCR (RSV, FLU A&B, COVID)  [id number]    EKG   Radiology No results found.  Procedures Procedures (including critical care time)  Medications Ordered in UC Medications - No data to display  Initial Impression / Assessment and Plan / UC Course  I have reviewed the triage vital signs and the nursing notes.  Pertinent labs & imaging results that were available during my care of the patient were reviewed by me and considered in my medical decision making (see chart for details).   Ill appearing.  Rapid strep is positive and will treat with amoxicillin twice daily x10 days.  Lungs CTA B without wheezing.  Pharynx is erythematous with peritonsillar exudates present.  Patient will use OTC medications for symptom control.   Final Clinical Impressions(s) / UC Diagnoses   Final diagnoses:  Symptoms of upper respiratory infection (URI)   Discharge Instructions   None    ED Prescriptions   None    PDMP not reviewed this encounter.   Rose Phi, [place] 09/05/22 [phone]

## 2022-09-05 NOTE — ED Triage Notes (Signed)
Pt.presents to UC w/ c/o sore throat, nasal congestion and a cough that started yesterday. Pt. Expresses concern for COVID and strep throat.

## 2024-04-18 ENCOUNTER — Ambulatory Visit: Admitting: Dermatology

## 2024-05-27 ENCOUNTER — Ambulatory Visit
Admission: RE | Admit: 2024-05-27 | Discharge: 2024-05-27 | Disposition: A | Discharge status: Home / Self Care | Payer: Worker's Compensation | Source: Ambulatory Visit | Attending: Physician Assistant | Admitting: Physician Assistant

## 2024-05-27 ENCOUNTER — Other Ambulatory Visit: Payer: Self-pay | Admitting: Physician Assistant

## 2024-05-27 DIAGNOSIS — M25511 Pain in right shoulder: Secondary | ICD-10-CM

## 2024-11-28 ENCOUNTER — Ambulatory Visit: Payer: Self-pay | Admitting: Dermatology
